# Patient Record
Sex: Female | Born: 1975 | Race: Black or African American | Hispanic: No | Marital: Single | Smoking: Current every day smoker
Health system: Southern US, Community
[De-identification: ages and names within clinical notes are randomized; demographics above are authoritative.]

---

## 2008-10-04 ENCOUNTER — Emergency Department (HOSPITAL_COMMUNITY): Admission: EM | Admit: 2008-10-04 | Discharge: 2008-10-04 | Payer: Self-pay | Admitting: Emergency Medicine
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
## 2011-08-27 LAB — GLUCOSE, CAPILLARY: Glucose-Capillary: 454 — ABNORMAL HIGH

## 2011-09-18 ENCOUNTER — Emergency Department (HOSPITAL_COMMUNITY)
Admission: EM | Admit: 2011-09-18 | Discharge: 2011-09-18 | Disposition: A | Discharge status: Home / Self Care | Payer: Self-pay | Attending: Emergency Medicine | Admitting: Emergency Medicine

## 2011-09-18 DIAGNOSIS — R112 Nausea with vomiting, unspecified: Secondary | ICD-10-CM | POA: Insufficient documentation

## 2011-09-18 DIAGNOSIS — R109 Unspecified abdominal pain: Secondary | ICD-10-CM | POA: Insufficient documentation

## 2011-09-18 DIAGNOSIS — R7309 Other abnormal glucose: Secondary | ICD-10-CM | POA: Insufficient documentation

## 2011-09-18 DIAGNOSIS — R197 Diarrhea, unspecified: Secondary | ICD-10-CM | POA: Insufficient documentation

## 2011-09-18 LAB — COMPREHENSIVE METABOLIC PANEL
ALT: 14 U/L (ref 0–35)
AST: 15 U/L (ref 0–37)
Albumin: 4.7 g/dL (ref 3.5–5.2)
Alkaline Phosphatase: 50 U/L (ref 39–117)
BUN: 9 mg/dL (ref 6–23)
CO2: 21 mEq/L (ref 19–32)
Calcium: 9.6 mg/dL (ref 8.4–10.5)
Chloride: 104 mEq/L (ref 96–112)
Creatinine, Ser: 0.52 mg/dL (ref 0.50–1.10)
GFR calc Af Amer: >90 mL/min (ref >90)
GFR calc non Af Amer: >90 mL/min (ref >90)
Glucose, Bld: 128 mg/dL — ABNORMAL HIGH (ref 70–99)
Potassium: 3.6 mEq/L (ref 3.5–5.1)
Sodium: 139 mEq/L (ref 135–145)
Total Bilirubin: 0.3 mg/dL (ref 0.3–1.2)
Total Protein: 7.8 g/dL (ref 6.0–8.3)

## 2011-09-18 LAB — URINALYSIS, ROUTINE W REFLEX MICROSCOPIC
Bilirubin Urine: NEGATIVE
Glucose, UA: NEGATIVE mg/dL
Ketones, ur: >80 mg/dL — AB
Leukocytes, UA: NEGATIVE
Nitrite: NEGATIVE
Protein, ur: NEGATIVE mg/dL
Specific Gravity, Urine: 1.028 (ref 1.005–1.030)
Urobilinogen, UA: 0.2 mg/dL (ref 0.0–1.0)
pH: 6 (ref 5.0–8.0)

## 2011-09-18 LAB — DIFFERENTIAL
Basophils Absolute: 0 10*3/uL (ref 0.0–0.1)
Basophils Relative: 0 % (ref 0–1)
Eosinophils Absolute: 0 10*3/uL (ref 0.0–0.7)
Eosinophils Relative: 0 % (ref 0–5)
Lymphocytes Relative: 15 % (ref 12–46)
Lymphs Abs: 0.9 10*3/uL (ref 0.7–4.0)
Monocytes Absolute: 0.3 10*3/uL (ref 0.1–1.0)
Monocytes Relative: 4 % (ref 3–12)
Neutro Abs: 4.8 10*3/uL (ref 1.7–7.7)
Neutrophils Relative %: 81 % — ABNORMAL HIGH (ref 43–77)

## 2011-09-18 LAB — URINE MICROSCOPIC-ADD ON

## 2011-09-18 LAB — LIPASE, BLOOD: Lipase: 12 U/L (ref 11–59)

## 2011-09-18 LAB — CBC
HCT: 46.1 % — ABNORMAL HIGH (ref 36.0–46.0)
Hemoglobin: 15.8 g/dL — ABNORMAL HIGH (ref 12.0–15.0)
MCH: 30.6 pg (ref 26.0–34.0)
MCHC: 34.3 g/dL (ref 30.0–36.0)
MCV: 89.2 fL (ref 78.0–100.0)
Platelets: 186 10*3/uL (ref 150–400)
RBC: 5.17 MIL/uL — ABNORMAL HIGH (ref 3.87–5.11)
RDW: 12.2 % (ref 11.5–15.5)
WBC: 5.9 10*3/uL (ref 4.0–10.5)

## 2011-09-18 LAB — POCT PREGNANCY, URINE: Preg Test, Ur: NEGATIVE

## 2016-07-16 ENCOUNTER — Encounter (HOSPITAL_COMMUNITY): Payer: Self-pay

## 2016-07-16 ENCOUNTER — Emergency Department (HOSPITAL_COMMUNITY)
Admission: EM | Admit: 2016-07-16 | Discharge: 2016-07-16 | Disposition: A | Discharge status: Home / Self Care | Payer: Self-pay | Attending: Emergency Medicine | Admitting: Emergency Medicine

## 2016-07-16 DIAGNOSIS — R1030 Lower abdominal pain, unspecified: Secondary | ICD-10-CM | POA: Insufficient documentation

## 2016-07-16 DIAGNOSIS — R197 Diarrhea, unspecified: Secondary | ICD-10-CM | POA: Insufficient documentation

## 2016-07-16 DIAGNOSIS — R112 Nausea with vomiting, unspecified: Secondary | ICD-10-CM | POA: Insufficient documentation

## 2016-07-16 DIAGNOSIS — F17210 Nicotine dependence, cigarettes, uncomplicated: Secondary | ICD-10-CM | POA: Insufficient documentation

## 2016-07-16 LAB — URINALYSIS, ROUTINE W REFLEX MICROSCOPIC
Bilirubin Urine: NEGATIVE
Glucose, UA: NEGATIVE mg/dL
Ketones, ur: 15 mg/dL — AB
Leukocytes, UA: NEGATIVE
Nitrite: NEGATIVE
Protein, ur: NEGATIVE mg/dL
Specific Gravity, Urine: 1.007 (ref 1.005–1.030)
pH: 6.5 (ref 5.0–8.0)

## 2016-07-16 LAB — CBC
HCT: 50.1 % — ABNORMAL HIGH (ref 36.0–46.0)
Hemoglobin: 17.6 g/dL — ABNORMAL HIGH (ref 12.0–15.0)
MCH: 30.8 pg (ref 26.0–34.0)
MCHC: 35.1 g/dL (ref 30.0–36.0)
MCV: 87.7 fL (ref 78.0–100.0)
Platelets: 212 10*3/uL (ref 150–400)
RBC: 5.71 MIL/uL — ABNORMAL HIGH (ref 3.87–5.11)
RDW: 12.1 % (ref 11.5–15.5)
WBC: 7.2 10*3/uL (ref 4.0–10.5)

## 2016-07-16 LAB — COMPREHENSIVE METABOLIC PANEL
ALT: 23 U/L (ref 14–54)
AST: 22 U/L (ref 15–41)
Albumin: 4.8 g/dL (ref 3.5–5.0)
Alkaline Phosphatase: 39 U/L (ref 38–126)
Anion gap: 10 (ref 5–15)
BUN: 13 mg/dL (ref 6–20)
CO2: 27 mmol/L (ref 22–32)
Calcium: 9 mg/dL (ref 8.9–10.3)
Chloride: 97 mmol/L — ABNORMAL LOW (ref 101–111)
Creatinine, Ser: 0.86 mg/dL (ref 0.44–1.00)
GFR calc Af Amer: >60 mL/min (ref >60)
GFR calc non Af Amer: >60 mL/min (ref >60)
Glucose, Bld: 94 mg/dL (ref 65–99)
Potassium: 3 mmol/L — ABNORMAL LOW (ref 3.5–5.1)
Sodium: 134 mmol/L — ABNORMAL LOW (ref 135–145)
Total Bilirubin: 1.9 mg/dL — ABNORMAL HIGH (ref 0.3–1.2)
Total Protein: 8.1 g/dL (ref 6.5–8.1)

## 2016-07-16 LAB — I-STAT BETA HCG BLOOD, ED (MC, WL, AP ONLY): I-stat hCG, quantitative: <5 m[IU]/mL (ref <5)

## 2016-07-16 LAB — URINE MICROSCOPIC-ADD ON

## 2016-07-16 LAB — LIPASE, BLOOD: Lipase: 22 U/L (ref 11–51)

## 2016-07-16 MED ORDER — ONDANSETRON HCL 4 MG/2ML IJ SOLN
4.0000 mg | Freq: Once | INTRAMUSCULAR | Status: AC
  Administered 2016-07-16: 4 mg via INTRAVENOUS
  Filled 2016-07-16: qty 2

## 2016-07-16 MED ORDER — SODIUM CHLORIDE 0.9 % IV BOLUS (SEPSIS)
1000.0000 mL | Freq: Once | INTRAVENOUS | Status: AC
  Administered 2016-07-16: 1000 mL via INTRAVENOUS

## 2016-07-16 MED ORDER — ONDANSETRON 8 MG PO TBDP: 8.0000 mg | ORAL_TABLET | Freq: Three times a day (TID) | ORAL | 0 refills | Status: AC | PRN

## 2016-07-16 NOTE — ED Triage Notes (Signed)
Pt c/o lower abdominal pain and n/v/d x 4 days.  Pain score 7/10.  Pt has not been taking anything for symptoms.  Sts "I have been drinking a lot of fluids."  Pt thinks she "got food poisoning from some beef brisket."

## 2016-07-16 NOTE — ED Provider Notes (Signed)
[facility]-EMERGENCY DEPT Provider Note   CSN: [id number] Arrival date &[id number] time: 07/16/16  0718     History   Chief Complaint Chief Complaint  Patient presents with  . Abdominal Pain  . Emesis  . Diarrhea    HPI Kelly Graves is a 40 y.o. female.  The history is provided by the patient.  Abdominal Pain   Associated symptoms include diarrhea and vomiting. Pertinent negatives include fever.  Emesis   Associated symptoms include abdominal pain and diarrhea. Pertinent negatives include no fever.  Diarrhea   Associated symptoms include abdominal pain and vomiting.  Patient presents with a four-day history of nausea vomiting diarrhea. States she thinks it could come from some beef brisket that she had. Patient's family member thinks it could come from some greens. She has some mild lower abdominal pain. States she has not eaten much in 4 days. States she feels bad. No other people were having nausea and vomiting. No vaginal bleeding or discharge. Recently had her menses and it was normal.  History reviewed. No pertinent past medical history.  There are no active problems to display for this patient.   History reviewed. No pertinent surgical history.  OB History    No data available       Home Medications    Prior to Admission medications   Medication Sig Start Date End Date Taking? Authorizing Provider  ondansetron (ZOFRAN-ODT) 8 MG disintegrating tablet Take 1 tablet (8 mg total) by mouth every 8 (eight) hours as needed for nausea or vomiting. 07/16/16   Benjiman CoreNathan Atlantis Delong, MD    Family History History reviewed. No pertinent family history.  Social History Social History  Substance Use Topics  . Smoking status: Current Every Day Smoker    Packs/day: 0.50    Types: Cigarettes  . Smokeless tobacco: Never Used  . Alcohol use Yes     Comment: occ     Allergies   Review of patient's allergies indicates no known allergies.   Review of Systems Review of Systems    Constitutional: Negative for activity change, diaphoresis and fever.  HENT: Negative for trouble swallowing.   Respiratory: Negative for shortness of breath.   Cardiovascular: Negative for chest pain.  Gastrointestinal: Positive for abdominal pain, diarrhea and vomiting.  Endocrine: Negative for polyphagia and polyuria.  Genitourinary: Negative for difficulty urinating, pelvic pain, vaginal discharge and vaginal pain.  Musculoskeletal: Negative for neck pain.  Skin: Negative for wound.  Neurological: Negative for weakness and numbness.     Physical Exam Updated Vital Signs BP 113/87 (BP Location: Right Arm)   Pulse 88   Temp 98.2 F (36.8 C) (Oral)   Resp 16   LMP 07/15/2016   SpO2 98%   Physical Exam  Constitutional: She appears well-developed and well-nourished.  HENT:  Head: Atraumatic.  Neck: Neck supple.  Cardiovascular: Normal rate.   Pulmonary/Chest: Effort normal.  Abdominal: Soft. She exhibits no mass. There is no tenderness. There is no guarding. No hernia.  Musculoskeletal: Normal range of motion.  Neurological: She is alert.  Skin: Skin is warm.  Psychiatric: She has a normal mood and affect.     ED Treatments / Results  Labs (all labs ordered are listed, but only abnormal results are displayed) Labs Reviewed  COMPREHENSIVE METABOLIC PANEL - Abnormal; Notable for the following:       Result Value   Sodium 134 (*)    Potassium 3.0 (*)    Chloride 97 (*)    Total Bilirubin 1.9 (*)  All other components within normal limits  CBC - Abnormal; Notable for the following:    RBC 5.71 (*)    Hemoglobin 17.6 (*)    HCT 50.1 (*)    All other components within normal limits  URINALYSIS, ROUTINE W REFLEX MICROSCOPIC (NOT AT [facility]) - Abnormal; Notable for the following:    Hgb urine dipstick MODERATE (*)    Ketones, ur 15 (*)    All other components within normal limits  URINE MICROSCOPIC-ADD ON - Abnormal; Notable for the following:    Squamous Epithelial  / LPF 0-5 (*)    Bacteria, UA RARE (*)    All other components within normal limits  LIPASE, BLOOD  I-STAT BETA HCG BLOOD, ED ([facility], [facility], [facility] ONLY)    EKG  EKG Interpretation None       Radiology No results found.  Procedures Procedures (including critical care time)  Medications Ordered in ED Medications  ondansetron (ZOFRAN) injection 4 mg (4 mg Intravenous Given 07/16/16 0759)  sodium chloride 0.9 % bolus 1,000 mL (1,000 mLs Intravenous New Bag/Given 07/16/16 0759)     Initial Impression / Assessment and Plan / ED Course  I have reviewed the triage vital signs and the nursing notes.  Pertinent labs & imaging results that were available during my care of the patient were reviewed by me and considered in my medical decision making (see chart for details).  Clinical Course    Patient with nausea vomiting diarrhea and abdominal pain. Feels better after treatment. Some dehydration. Will discharge home. Does not want to attempt orals in the ER.  Final Clinical Impressions(s) / ED Diagnoses   Final diagnoses:  Nausea vomiting and diarrhea    New Prescriptions New Prescriptions   ONDANSETRON (ZOFRAN-ODT) 8 MG DISINTEGRATING TABLET    Take 1 tablet (8 mg total) by mouth every 8 (eight) hours as needed for nausea or vomiting.     Benjiman CoreNathan Pruitt Taboada, MD 07/16/16 1014

## 2018-06-12 ENCOUNTER — Other Ambulatory Visit: Payer: Self-pay | Admitting: Family Medicine

## 2018-06-12 ENCOUNTER — Other Ambulatory Visit (HOSPITAL_COMMUNITY)
Admission: RE | Admit: 2018-06-12 | Discharge: 2018-06-12 | Disposition: A | Discharge status: Home / Self Care | Payer: BLUE CROSS/BLUE SHIELD | Source: Ambulatory Visit | Attending: Family Medicine | Admitting: Family Medicine

## 2018-06-12 DIAGNOSIS — Z0000 Encounter for general adult medical examination without abnormal findings: Secondary | ICD-10-CM | POA: Diagnosis not present

## 2018-06-12 DIAGNOSIS — Z136 Encounter for screening for cardiovascular disorders: Secondary | ICD-10-CM | POA: Diagnosis not present

## 2018-06-12 DIAGNOSIS — Z13220 Encounter for screening for lipoid disorders: Secondary | ICD-10-CM | POA: Diagnosis not present

## 2018-06-16 LAB — CYTOLOGY - PAP: HPV: NOT DETECTED

## 2018-08-27 DIAGNOSIS — N3001 Acute cystitis with hematuria: Secondary | ICD-10-CM | POA: Diagnosis not present

## 2018-08-27 DIAGNOSIS — R350 Frequency of micturition: Secondary | ICD-10-CM | POA: Diagnosis not present

## 2018-08-27 DIAGNOSIS — R399 Unspecified symptoms and signs involving the genitourinary system: Secondary | ICD-10-CM | POA: Diagnosis not present

## 2018-09-28 DIAGNOSIS — S161XXA Strain of muscle, fascia and tendon at neck level, initial encounter: Secondary | ICD-10-CM | POA: Diagnosis not present

## 2018-09-28 DIAGNOSIS — F17210 Nicotine dependence, cigarettes, uncomplicated: Secondary | ICD-10-CM | POA: Diagnosis not present

## 2018-12-10 DIAGNOSIS — R42 Dizziness and giddiness: Secondary | ICD-10-CM | POA: Diagnosis not present

## 2018-12-10 DIAGNOSIS — B349 Viral infection, unspecified: Secondary | ICD-10-CM | POA: Diagnosis not present

## 2019-07-30 DIAGNOSIS — Z1231 Encounter for screening mammogram for malignant neoplasm of breast: Secondary | ICD-10-CM | POA: Diagnosis not present

## 2019-08-06 ENCOUNTER — Other Ambulatory Visit (HOSPITAL_COMMUNITY)
Admission: RE | Admit: 2019-08-06 | Discharge: 2019-08-06 | Disposition: A | Discharge status: Home / Self Care | Payer: BLUE CROSS/BLUE SHIELD | Source: Ambulatory Visit | Attending: Family Medicine | Admitting: Family Medicine

## 2019-08-06 ENCOUNTER — Other Ambulatory Visit: Payer: Self-pay | Admitting: Family Medicine

## 2019-08-06 DIAGNOSIS — Z0000 Encounter for general adult medical examination without abnormal findings: Secondary | ICD-10-CM | POA: Insufficient documentation

## 2019-08-06 DIAGNOSIS — Z13220 Encounter for screening for lipoid disorders: Secondary | ICD-10-CM | POA: Diagnosis not present

## 2019-08-06 DIAGNOSIS — B373 Candidiasis of vulva and vagina: Secondary | ICD-10-CM | POA: Diagnosis not present

## 2019-08-06 DIAGNOSIS — Z124 Encounter for screening for malignant neoplasm of cervix: Secondary | ICD-10-CM | POA: Diagnosis not present

## 2019-08-06 DIAGNOSIS — Z8742 Personal history of other diseases of the female genital tract: Secondary | ICD-10-CM | POA: Diagnosis not present

## 2019-08-10 LAB — CYTOLOGY - PAP
Adequacy: ABSENT
Chlamydia: NEGATIVE
Diagnosis: NEGATIVE
HPV: NOT DETECTED
Neisseria Gonorrhea: NEGATIVE

## 2020-05-08 DIAGNOSIS — Z03818 Encounter for observation for suspected exposure to other biological agents ruled out: Secondary | ICD-10-CM | POA: Diagnosis not present

## 2020-05-08 DIAGNOSIS — J22 Unspecified acute lower respiratory infection: Secondary | ICD-10-CM | POA: Diagnosis not present

## 2020-05-08 DIAGNOSIS — Z20828 Contact with and (suspected) exposure to other viral communicable diseases: Secondary | ICD-10-CM | POA: Diagnosis not present

## 2020-05-11 DIAGNOSIS — Z20822 Contact with and (suspected) exposure to covid-19: Secondary | ICD-10-CM | POA: Diagnosis not present

## 2020-08-11 DIAGNOSIS — Z13220 Encounter for screening for lipoid disorders: Secondary | ICD-10-CM | POA: Diagnosis not present

## 2020-08-11 DIAGNOSIS — Z0000 Encounter for general adult medical examination without abnormal findings: Secondary | ICD-10-CM | POA: Diagnosis not present

## 2020-11-02 DIAGNOSIS — Z1231 Encounter for screening mammogram for malignant neoplasm of breast: Secondary | ICD-10-CM | POA: Diagnosis not present

## 2021-06-26 DIAGNOSIS — F17210 Nicotine dependence, cigarettes, uncomplicated: Secondary | ICD-10-CM | POA: Diagnosis not present

## 2021-06-26 DIAGNOSIS — R519 Headache, unspecified: Secondary | ICD-10-CM | POA: Diagnosis not present

## 2021-06-26 DIAGNOSIS — R0789 Other chest pain: Secondary | ICD-10-CM | POA: Diagnosis not present

## 2021-06-26 DIAGNOSIS — R110 Nausea: Secondary | ICD-10-CM | POA: Diagnosis not present

## 2021-06-26 DIAGNOSIS — R946 Abnormal results of thyroid function studies: Secondary | ICD-10-CM | POA: Diagnosis not present

## 2021-08-10 DIAGNOSIS — Z20822 Contact with and (suspected) exposure to covid-19: Secondary | ICD-10-CM | POA: Diagnosis not present

## 2021-08-28 DIAGNOSIS — R946 Abnormal results of thyroid function studies: Secondary | ICD-10-CM | POA: Diagnosis not present

## 2021-10-31 ENCOUNTER — Other Ambulatory Visit: Payer: Self-pay

## 2021-10-31 ENCOUNTER — Encounter (HOSPITAL_COMMUNITY): Payer: Self-pay

## 2021-10-31 ENCOUNTER — Emergency Department (HOSPITAL_COMMUNITY): Payer: BC Managed Care – PPO

## 2021-10-31 ENCOUNTER — Emergency Department (HOSPITAL_COMMUNITY)
Admission: EM | Admit: 2021-10-31 | Discharge: 2021-10-31 | Disposition: A | Discharge status: Home / Self Care | Payer: BC Managed Care – PPO | Attending: Emergency Medicine | Admitting: Emergency Medicine

## 2021-10-31 DIAGNOSIS — M25552 Pain in left hip: Secondary | ICD-10-CM | POA: Diagnosis not present

## 2021-10-31 DIAGNOSIS — M5442 Lumbago with sciatica, left side: Secondary | ICD-10-CM | POA: Diagnosis not present

## 2021-10-31 DIAGNOSIS — F17210 Nicotine dependence, cigarettes, uncomplicated: Secondary | ICD-10-CM | POA: Diagnosis not present

## 2021-10-31 LAB — CBC WITH DIFFERENTIAL/PLATELET
Abs Immature Granulocytes: 0.01 10*3/uL (ref 0.00–0.07)
Basophils Absolute: 0 10*3/uL (ref 0.0–0.1)
Basophils Relative: 0 %
Eosinophils Absolute: 0 10*3/uL (ref 0.0–0.5)
Eosinophils Relative: 0 %
HCT: 44.4 % (ref 36.0–46.0)
Hemoglobin: 14.6 g/dL (ref 12.0–15.0)
Immature Granulocytes: 0 %
Lymphocytes Relative: 21 %
Lymphs Abs: 1.4 10*3/uL (ref 0.7–4.0)
MCH: 29.9 pg (ref 26.0–34.0)
MCHC: 32.9 g/dL (ref 30.0–36.0)
MCV: 91 fL (ref 80.0–100.0)
Monocytes Absolute: 0.3 10*3/uL (ref 0.1–1.0)
Monocytes Relative: 5 %
Neutro Abs: 4.8 10*3/uL (ref 1.7–7.7)
Neutrophils Relative %: 74 %
Platelets: 219 10*3/uL (ref 150–400)
RBC: 4.88 MIL/uL (ref 3.87–5.11)
RDW: 11.9 % (ref 11.5–15.5)
WBC: 6.5 10*3/uL (ref 4.0–10.5)
nRBC: 0 % (ref 0.0–0.2)

## 2021-10-31 LAB — COMPREHENSIVE METABOLIC PANEL
ALT: 19 U/L (ref 0–44)
AST: 17 U/L (ref 15–41)
Albumin: 4.5 g/dL (ref 3.5–5.0)
Alkaline Phosphatase: 43 U/L (ref 38–126)
Anion gap: 7 (ref 5–15)
BUN: 13 mg/dL (ref 6–20)
CO2: 23 mmol/L (ref 22–32)
Calcium: 8.7 mg/dL — ABNORMAL LOW (ref 8.9–10.3)
Chloride: 107 mmol/L (ref 98–111)
Creatinine, Ser: 0.61 mg/dL (ref 0.44–1.00)
GFR, Estimated: >60 mL/min (ref >60)
Glucose, Bld: 119 mg/dL — ABNORMAL HIGH (ref 70–99)
Potassium: 3.7 mmol/L (ref 3.5–5.1)
Sodium: 137 mmol/L (ref 135–145)
Total Bilirubin: 0.6 mg/dL (ref 0.3–1.2)
Total Protein: 7.8 g/dL (ref 6.5–8.1)

## 2021-10-31 MED ORDER — PREDNISONE 10 MG PO TABS: 40.0000 mg | ORAL_TABLET | Freq: Every day | ORAL | 0 refills | Status: AC

## 2021-10-31 MED ORDER — HYDROCODONE-ACETAMINOPHEN 5-325 MG PO TABS: 1.0000 | ORAL_TABLET | Freq: Four times a day (QID) | ORAL | 0 refills | Status: AC | PRN

## 2021-10-31 NOTE — Discharge Instructions (Addendum)
Continue to take the leave.  Take the prednisone as directed prescription provided.  Take the hydrocodone as needed for additional pain relief.  Your rest work note provided.  Follow-up if not improving over the next week or so.  Referral information provided to wellness clinic.

## 2021-10-31 NOTE — ED Provider Notes (Signed)
[facility] DEPT Provider Note   CSN: [id number] Arrival date & time: 10/31/21  [id number]     History Chief Complaint  Patient presents with   Hip Pain    Kelly Graves is a 45 y.o. female.  Patient with complaint of pain to the left back left hip area now radiating down the back of the leg for the past 2 days.  No numbness or weakness to the foot.  Difficult to walk this morning due to the pain.  Patient took Aleve at home with some improvement.  Is never had a problem here before no fall or injury.  Denies any numbness or weakness to her foot.  No incontinence.      History reviewed. No pertinent past medical history.  There are no problems to display for this patient.   History reviewed. No pertinent surgical history.   OB History   No obstetric history on file.     History reviewed. No pertinent family history.  Social History   Tobacco Use   Smoking status: Every Day    Packs/day: 0.50    Types: Cigarettes   Smokeless tobacco: Never  Substance Use Topics   Alcohol use: Yes    Comment: occ   Drug use: No    Home Medications Prior to Admission medications   Medication Sig Start Date End Date Taking? Authorizing Provider  ondansetron (ZOFRAN-ODT) 8 MG disintegrating tablet Take 1 tablet (8 mg total) by mouth every 8 (eight) hours as needed for nausea or vomiting. 07/16/16   Davonna Belling, MD    Allergies    Patient has no known allergies.  Review of Systems   Review of Systems  Constitutional:  Negative for chills and fever.  HENT:  Negative for ear pain and sore throat.   Eyes:  Negative for pain and visual disturbance.  Respiratory:  Negative for cough and shortness of breath.   Cardiovascular:  Negative for chest pain and palpitations.  Gastrointestinal:  Negative for abdominal pain and vomiting.  Genitourinary:  Negative for dysuria and hematuria.  Musculoskeletal:  Positive for back pain. Negative for  arthralgias.  Skin:  Negative for color change and rash.  Neurological:  Negative for seizures and syncope.  All other systems reviewed and are negative.  Physical Exam Updated Vital Signs BP 130/85 (BP Location: Right Arm)   Pulse 67   Temp 98 F (36.7 C) (Oral)   Resp 15   Ht 1.6 m (5\' 3" )   Wt 59 kg   SpO2 100%   BMI 23.03 kg/m   Physical Exam Vitals and nursing note reviewed.  Constitutional:      General: She is not in acute distress.    Appearance: Normal appearance. She is well-developed.  HENT:     Head: Normocephalic and atraumatic.  Eyes:     Conjunctiva/sclera: Conjunctivae normal.  Cardiovascular:     Rate and Rhythm: Normal rate and regular rhythm.     Heart sounds: No murmur heard. Pulmonary:     Effort: Pulmonary effort is normal. No respiratory distress.     Breath sounds: Normal breath sounds.  Abdominal:     Palpations: Abdomen is soft.     Tenderness: There is no abdominal tenderness.  Musculoskeletal:        General: No swelling, tenderness, deformity or signs of injury.     Cervical back: Neck supple.     Right lower leg: No edema.     Left lower leg: No edema.  Comments: Left hip nontender to palpation left lower back nontender left thigh nontender left calf nontender.  Dorsalis pedis pulse 2+.  Sensation intact.  Some pain with range of motion at the left back area.  Skin:    General: Skin is warm and dry.     Capillary Refill: Capillary refill takes less than 2 seconds.  Neurological:     General: No focal deficit present.     Mental Status: She is alert and oriented to person, place, and time.  Psychiatric:        Mood and Affect: Mood normal.    ED Results / Procedures / Treatments   Labs (all labs ordered are listed, but only abnormal results are displayed) Labs Reviewed  COMPREHENSIVE METABOLIC PANEL - Abnormal; Notable for the following components:      Result Value   Glucose, Bld 119 (*)    Calcium 8.7 (*)    All other  components within normal limits  CBC WITH DIFFERENTIAL/PLATELET    EKG None  Radiology DG Hip Unilat W or Wo Pelvis 2-3 Views Left  Result Date: 10/31/2021 CLINICAL DATA:  Pain. Additional history provided: Chronic increasing left hip pain with no recent injury. EXAM: DG HIP (WITH OR WITHOUT PELVIS) 2-3V LEFT COMPARISON:  Radiographs of the lumbar spine 10/04/2008. FINDINGS: There is normal bony alignment. No evidence of acute osseous or articular abnormality. The joint spaces are maintained. IMPRESSION: No evidence of acute osseous or articular abnormality. Electronically Signed   By: Jackey Loge D.O.   On: 10/31/2021 07:40    Procedures Procedures   Medications Ordered in ED Medications - No data to display  ED Course  I have reviewed the triage vital signs and the nursing notes.  Pertinent labs & imaging results that were available during my care of the patient were reviewed by me and considered in my medical decision making (see chart for details).    MDM Rules/Calculators/A&P                           X-ray of left hip and labs show no significant abnormalities.  Symptoms seem to be consistent with left-sided sciatica low back pain.  Was only present for 2 days.  We will treat symptomatically.  We will put on a course of prednisone.  Have her continue Aleve and a work note.   Final Clinical Impression(s) / ED Diagnoses Final diagnoses:  Left hip pain  Acute left-sided low back pain with left-sided sciatica    Rx / DC Orders ED Discharge Orders     None        Vanetta Mulders, MD 10/31/21 [phone]

## 2021-10-31 NOTE — ED Triage Notes (Signed)
Pt reports with left hip pain since last night.

## 2021-10-31 NOTE — ED Notes (Signed)
Save blue in main lab 

## 2021-12-03 DIAGNOSIS — M545 Low back pain, unspecified: Secondary | ICD-10-CM | POA: Diagnosis not present

## 2021-12-03 DIAGNOSIS — Z6823 Body mass index (BMI) 23.0-23.9, adult: Secondary | ICD-10-CM | POA: Diagnosis not present

## 2021-12-03 DIAGNOSIS — M4726 Other spondylosis with radiculopathy, lumbar region: Secondary | ICD-10-CM | POA: Diagnosis not present

## 2022-08-05 IMAGING — CR DG HIP (WITH OR WITHOUT PELVIS) 2-3V*L*
3 series · 3 of 3 positions shown · non-contrast
Comparison: Radiographs of the lumbar spine 10/04/2008.

CLINICAL DATA: Pain. Additional history provided: Chronic
increasing left hip pain with no recent injury.

EXAM:
DG HIP (WITH OR WITHOUT PELVIS) 2-3V LEFT

[t pelvis ap]
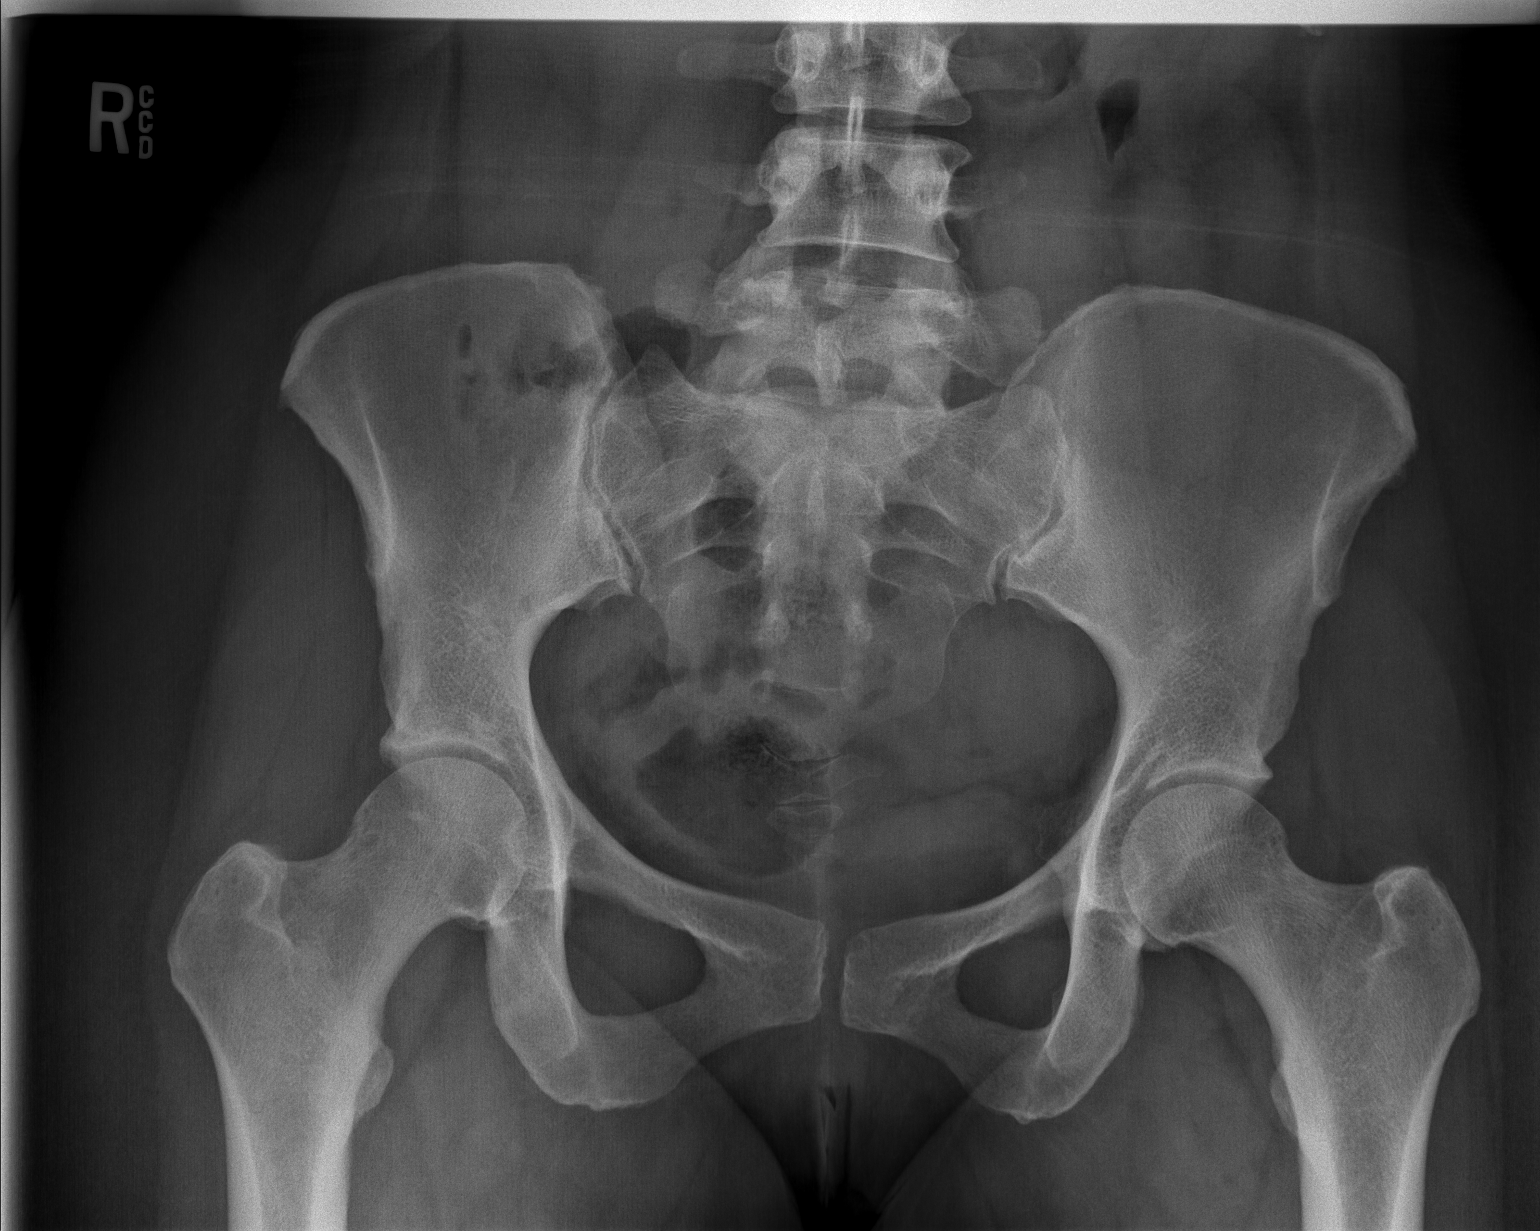

[t hip ap left]
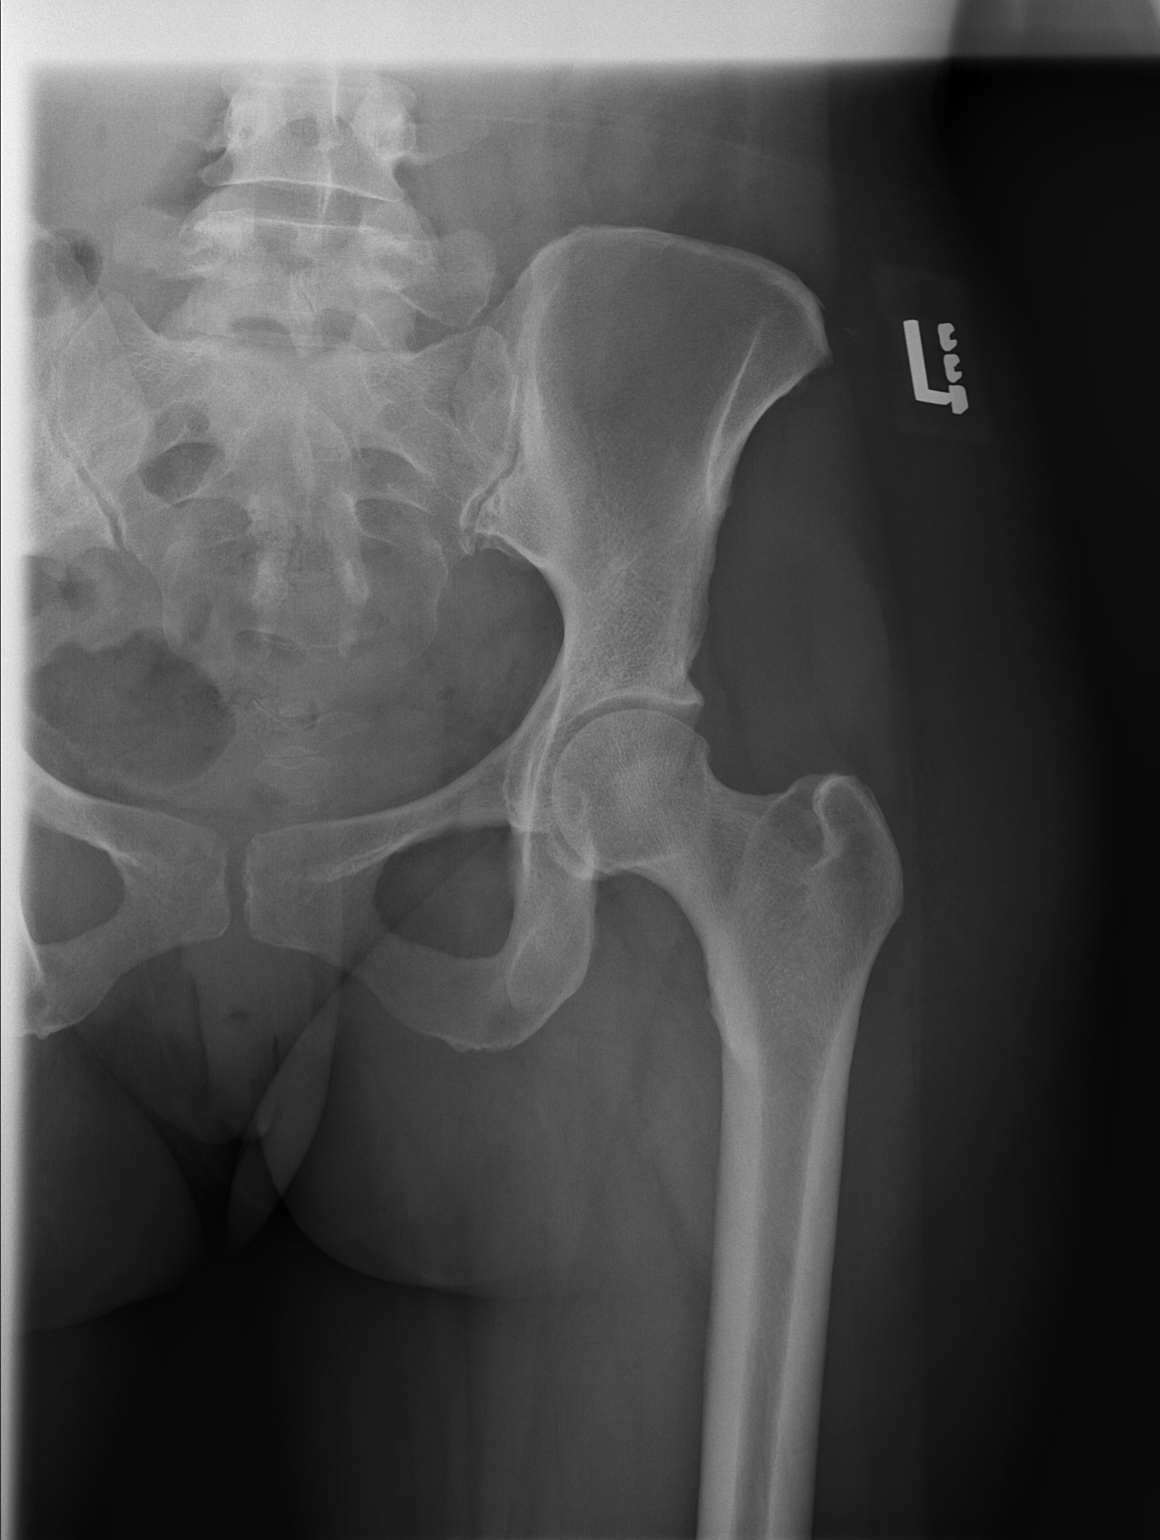

[t hip frog leg left]
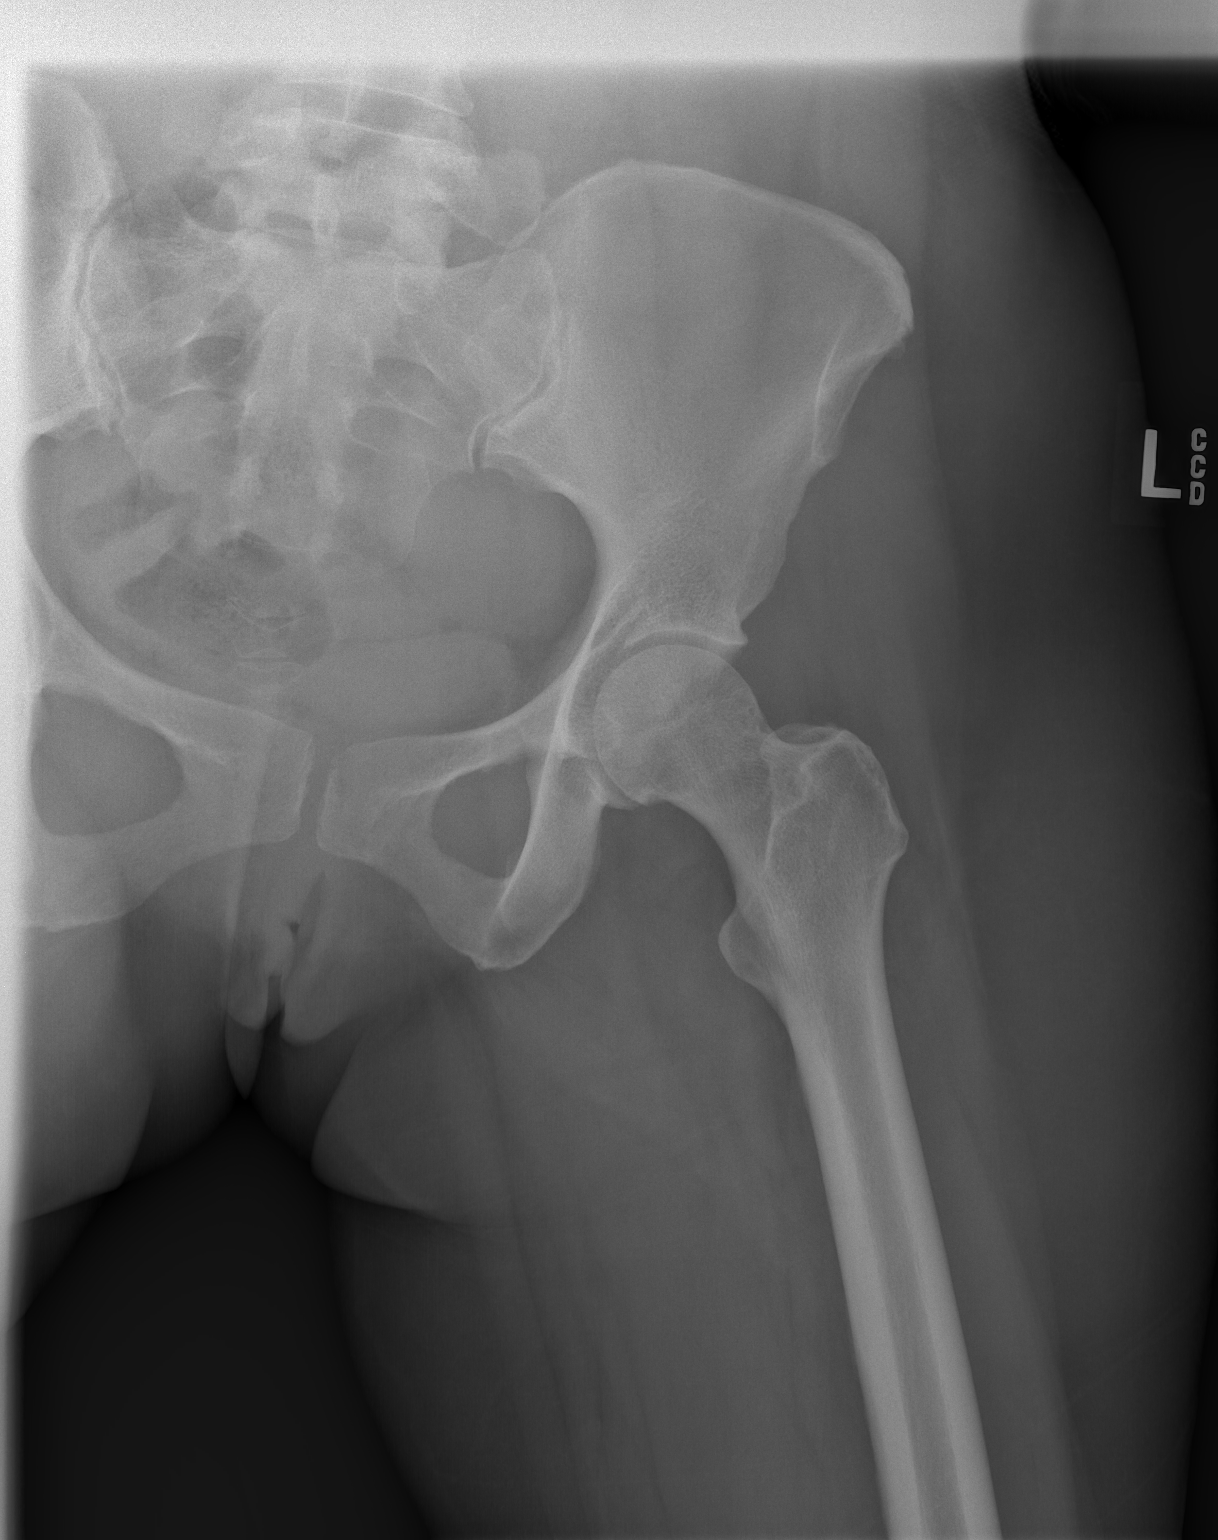

[3 of 3 positions shown; findings below may reference images not displayed]

FINDINGS: There is normal bony alignment.

No evidence of acute osseous or articular abnormality.

The joint spaces are maintained.
IMPRESSION: No evidence of acute osseous or articular abnormality.

## 2022-12-21 DIAGNOSIS — U071 COVID-19: Secondary | ICD-10-CM | POA: Diagnosis not present

## 2022-12-21 DIAGNOSIS — R509 Fever, unspecified: Secondary | ICD-10-CM | POA: Diagnosis not present

## 2022-12-21 DIAGNOSIS — R5383 Other fatigue: Secondary | ICD-10-CM | POA: Diagnosis not present

## 2022-12-21 DIAGNOSIS — R051 Acute cough: Secondary | ICD-10-CM | POA: Diagnosis not present
